# Patient Record
Sex: Female | Born: 1980 | Race: Black or African American | Hispanic: No | Smoking: Never smoker
Health system: Southern US, Community
[De-identification: ages and names within clinical notes are randomized; demographics above are authoritative.]

---

## 2018-03-03 DIAGNOSIS — F419 Anxiety disorder, unspecified: Secondary | ICD-10-CM | POA: Diagnosis not present

## 2018-03-03 DIAGNOSIS — R079 Chest pain, unspecified: Secondary | ICD-10-CM | POA: Diagnosis not present

## 2018-03-03 DIAGNOSIS — R002 Palpitations: Secondary | ICD-10-CM | POA: Diagnosis not present

## 2018-03-17 ENCOUNTER — Other Ambulatory Visit (HOSPITAL_COMMUNITY): Payer: Self-pay | Admitting: Nurse Practitioner

## 2018-03-17 DIAGNOSIS — R079 Chest pain, unspecified: Secondary | ICD-10-CM

## 2018-03-21 ENCOUNTER — Other Ambulatory Visit: Payer: Self-pay

## 2018-03-21 ENCOUNTER — Ambulatory Visit (HOSPITAL_COMMUNITY): Payer: 59 | Attending: Cardiovascular Disease

## 2018-03-21 DIAGNOSIS — I081 Rheumatic disorders of both mitral and tricuspid valves: Secondary | ICD-10-CM | POA: Diagnosis not present

## 2018-03-21 DIAGNOSIS — R079 Chest pain, unspecified: Secondary | ICD-10-CM | POA: Diagnosis not present

## 2018-04-15 DIAGNOSIS — F419 Anxiety disorder, unspecified: Secondary | ICD-10-CM | POA: Diagnosis not present

## 2018-04-15 DIAGNOSIS — Z8679 Personal history of other diseases of the circulatory system: Secondary | ICD-10-CM | POA: Diagnosis not present

## 2018-04-15 DIAGNOSIS — Z712 Person consulting for explanation of examination or test findings: Secondary | ICD-10-CM | POA: Diagnosis not present

## 2018-08-25 DIAGNOSIS — A059 Bacterial foodborne intoxication, unspecified: Secondary | ICD-10-CM | POA: Diagnosis not present

## 2018-08-25 DIAGNOSIS — R112 Nausea with vomiting, unspecified: Secondary | ICD-10-CM | POA: Diagnosis not present

## 2018-08-25 DIAGNOSIS — R197 Diarrhea, unspecified: Secondary | ICD-10-CM | POA: Diagnosis not present

## 2019-03-28 ENCOUNTER — Other Ambulatory Visit (HOSPITAL_COMMUNITY)
Admission: RE | Admit: 2019-03-28 | Discharge: 2019-03-28 | Disposition: A | Discharge status: Home / Self Care | Payer: 59 | Source: Ambulatory Visit | Attending: Nurse Practitioner | Admitting: Nurse Practitioner

## 2019-03-28 ENCOUNTER — Ambulatory Visit: Payer: 59 | Admitting: Nurse Practitioner

## 2019-03-28 ENCOUNTER — Other Ambulatory Visit: Payer: Self-pay

## 2019-03-28 ENCOUNTER — Encounter: Payer: Self-pay | Admitting: Nurse Practitioner

## 2019-03-28 VITALS — BP 110/70 | HR 76 | Temp 97.9°F | Ht 62.0 in | Wt 186.2 lb

## 2019-03-28 DIAGNOSIS — Z113 Encounter for screening for infections with a predominantly sexual mode of transmission: Secondary | ICD-10-CM | POA: Diagnosis not present

## 2019-03-28 DIAGNOSIS — R635 Abnormal weight gain: Secondary | ICD-10-CM

## 2019-03-28 NOTE — Progress Notes (Signed)
  Subjective:     Patient ID: Lisa Serrano , female    DOB: 06/30/1981 , 38 y.o.   MRN: [id number]   Chief Complaint  Patient presents with  . STD testing    HPI  She would like to have an STD check as routine, new partner.  She would also like to be checked for HIV and Syphyllis.  Has a history of Pelvic Inflammatory Disease.  She has tubal ligation.  No known exposure.   She has gained weight over the last 6 months - 10 lbs.  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  Exercising - peleton bike, treadmill, insanity and running.  Lift weights as well.      History reviewed. No pertinent past medical history.   Family History  Problem Relation Age of Onset  . Atrial fibrillation Mother   . Dementia Mother   . Hypothyroidism Sister     No current outpatient medications on file.   No Known Allergies   Review of Systems  Constitutional: Negative.   Respiratory: Negative.   Cardiovascular: Negative.  Negative for chest pain, palpitations and leg swelling.  Endocrine: Negative for polydipsia and polyphagia.  Skin: Negative.   Neurological: Negative for dizziness and headaches.  Psychiatric/Behavioral: Negative for agitation and confusion.     Today's Vitals   03/28/19 1213  BP: 110/70  Pulse: 76  Temp: 97.9 F (36.6 C)  TempSrc: Oral  Weight: 186 lb 3.2 oz (84.5 kg)  Height: 5\' 2"  (1.575 m)  PainSc: 0-No pain   Body mass index is 34.06 kg/m.   Objective:  Physical Exam Constitutional:      Appearance: Normal appearance.  Cardiovascular:     Rate and Rhythm: Normal rate and regular rhythm.     Pulses: Normal pulses.     Heart sounds: Normal heart sounds. No murmur.  Pulmonary:     Effort: Pulmonary effort is normal. No respiratory distress.     Breath sounds: Normal breath sounds.  Skin:    General: Skin is warm and dry.     Capillary Refill: Capillary refill takes less than 2 seconds.  Neurological:     General: No focal deficit present.   Mental Status: She is alert and oriented to person, place, and time.  Psychiatric:        Mood and Affect: Mood normal.        Behavior: Behavior normal.        Thought Content: Thought content normal.        Judgment: Judgment normal.         Assessment And Plan:     1. Weight gain  She has had a 10 lb weight gain in the last 6 months.    Will check metabolic panel  She is exercising regularly with Peleton   Encouraged to eat healthy diet low in sugar and carbs.  - TSH - CBC no Diff - Hemoglobin A1c  2. Screening examination for STD (sexually transmitted disease)  She has a new partner and would like to be checked for any STDs - HIV antibody (with reflex) - T pallidum Screening Cascade - Cervicovaginal ancillary only   Minette Brine, FNP    THE PATIENT IS ENCOURAGED TO PRACTICE SOCIAL DISTANCING DUE TO THE COVID-19 PANDEMIC.

## 2019-03-29 LAB — CBC
Hematocrit: 40.9 % (ref 34.0–46.6)
Hemoglobin: 13.8 g/dL (ref 11.1–15.9)
MCH: 34.3 pg — ABNORMAL HIGH (ref 26.6–33.0)
MCHC: 33.7 g/dL (ref 31.5–35.7)
MCV: 102 fL — ABNORMAL HIGH (ref 79–97)
Platelets: 216 10*3/uL (ref 150–450)
RBC: 4.02 x10E6/uL (ref 3.77–5.28)
RDW: 11.6 % — ABNORMAL LOW (ref 11.7–15.4)
WBC: 4.1 10*3/uL (ref 3.4–10.8)

## 2019-03-29 LAB — HEMOGLOBIN A1C
Est. average glucose Bld gHb Est-mCnc: 82 mg/dL
Hgb A1c MFr Bld: 4.5 % — ABNORMAL LOW (ref 4.8–5.6)

## 2019-03-29 LAB — T PALLIDUM SCREENING CASCADE: T pallidum Antibodies (TP-PA): NONREACTIVE

## 2019-03-29 LAB — HIV ANTIBODY (ROUTINE TESTING W REFLEX): HIV Screen 4th Generation wRfx: NONREACTIVE

## 2019-03-29 LAB — TSH: TSH: 3.29 u[IU]/mL (ref 0.450–4.500)

## 2019-03-31 LAB — CERVICOVAGINAL ANCILLARY ONLY
Bacterial vaginitis: POSITIVE — AB
Candida vaginitis: NEGATIVE
Chlamydia: NEGATIVE
Neisseria Gonorrhea: NEGATIVE
Trichomonas: NEGATIVE

## 2019-04-03 ENCOUNTER — Other Ambulatory Visit: Payer: Self-pay | Admitting: Nurse Practitioner

## 2019-04-03 DIAGNOSIS — B9689 Other specified bacterial agents as the cause of diseases classified elsewhere: Secondary | ICD-10-CM

## 2019-04-03 DIAGNOSIS — N760 Acute vaginitis: Secondary | ICD-10-CM

## 2019-04-03 MED ORDER — METRONIDAZOLE 500 MG PO TABS: 500.0000 mg | ORAL_TABLET | Freq: Two times a day (BID) | ORAL | 0 refills | Status: DC

## 2019-04-21 ENCOUNTER — Encounter: Payer: Self-pay | Admitting: Nurse Practitioner

## 2019-04-25 ENCOUNTER — Encounter: Payer: 59 | Admitting: Nurse Practitioner

## 2019-09-02 DIAGNOSIS — Z20828 Contact with and (suspected) exposure to other viral communicable diseases: Secondary | ICD-10-CM | POA: Diagnosis not present

## 2019-09-02 DIAGNOSIS — Z6830 Body mass index (BMI) 30.0-30.9, adult: Secondary | ICD-10-CM | POA: Diagnosis not present

## 2019-09-02 DIAGNOSIS — Z1389 Encounter for screening for other disorder: Secondary | ICD-10-CM | POA: Diagnosis not present

## 2019-09-02 DIAGNOSIS — Z1331 Encounter for screening for depression: Secondary | ICD-10-CM | POA: Diagnosis not present

## 2020-03-25 DIAGNOSIS — F411 Generalized anxiety disorder: Secondary | ICD-10-CM | POA: Diagnosis not present

## 2020-03-27 DIAGNOSIS — Z0001 Encounter for general adult medical examination with abnormal findings: Secondary | ICD-10-CM | POA: Diagnosis not present

## 2020-03-27 DIAGNOSIS — R5383 Other fatigue: Secondary | ICD-10-CM | POA: Diagnosis not present

## 2020-03-27 DIAGNOSIS — Z20822 Contact with and (suspected) exposure to covid-19: Secondary | ICD-10-CM | POA: Diagnosis not present

## 2020-03-27 DIAGNOSIS — F4322 Adjustment disorder with anxiety: Secondary | ICD-10-CM | POA: Diagnosis not present

## 2020-04-15 DIAGNOSIS — F411 Generalized anxiety disorder: Secondary | ICD-10-CM | POA: Diagnosis not present

## 2020-04-16 DIAGNOSIS — E559 Vitamin D deficiency, unspecified: Secondary | ICD-10-CM | POA: Diagnosis not present

## 2020-04-16 DIAGNOSIS — R790 Abnormal level of blood mineral: Secondary | ICD-10-CM | POA: Diagnosis not present

## 2020-04-16 DIAGNOSIS — Z6836 Body mass index (BMI) 36.0-36.9, adult: Secondary | ICD-10-CM | POA: Diagnosis not present

## 2020-04-16 DIAGNOSIS — E7800 Pure hypercholesterolemia, unspecified: Secondary | ICD-10-CM | POA: Diagnosis not present

## 2020-04-16 DIAGNOSIS — N951 Menopausal and female climacteric states: Secondary | ICD-10-CM | POA: Diagnosis not present

## 2020-04-18 DIAGNOSIS — R5382 Chronic fatigue, unspecified: Secondary | ICD-10-CM | POA: Diagnosis not present

## 2020-04-18 DIAGNOSIS — Z6836 Body mass index (BMI) 36.0-36.9, adult: Secondary | ICD-10-CM | POA: Diagnosis not present

## 2020-04-18 DIAGNOSIS — Z1339 Encounter for screening examination for other mental health and behavioral disorders: Secondary | ICD-10-CM | POA: Diagnosis not present

## 2020-04-18 DIAGNOSIS — E559 Vitamin D deficiency, unspecified: Secondary | ICD-10-CM | POA: Diagnosis not present

## 2020-04-18 DIAGNOSIS — N951 Menopausal and female climacteric states: Secondary | ICD-10-CM | POA: Diagnosis not present

## 2020-04-18 DIAGNOSIS — Z1331 Encounter for screening for depression: Secondary | ICD-10-CM | POA: Diagnosis not present

## 2020-04-18 DIAGNOSIS — F419 Anxiety disorder, unspecified: Secondary | ICD-10-CM | POA: Diagnosis not present

## 2020-04-18 DIAGNOSIS — R790 Abnormal level of blood mineral: Secondary | ICD-10-CM | POA: Diagnosis not present

## 2020-04-23 DIAGNOSIS — R5382 Chronic fatigue, unspecified: Secondary | ICD-10-CM | POA: Diagnosis not present

## 2020-04-23 DIAGNOSIS — N951 Menopausal and female climacteric states: Secondary | ICD-10-CM | POA: Diagnosis not present

## 2020-04-23 DIAGNOSIS — F5101 Primary insomnia: Secondary | ICD-10-CM | POA: Diagnosis not present

## 2020-04-24 DIAGNOSIS — E559 Vitamin D deficiency, unspecified: Secondary | ICD-10-CM | POA: Diagnosis not present

## 2020-04-24 DIAGNOSIS — E7800 Pure hypercholesterolemia, unspecified: Secondary | ICD-10-CM | POA: Diagnosis not present

## 2020-04-24 DIAGNOSIS — Z6836 Body mass index (BMI) 36.0-36.9, adult: Secondary | ICD-10-CM | POA: Diagnosis not present

## 2020-04-29 ENCOUNTER — Other Ambulatory Visit: Payer: 59

## 2020-04-30 ENCOUNTER — Telehealth: Payer: 59 | Admitting: Family

## 2020-04-30 DIAGNOSIS — U071 COVID-19: Secondary | ICD-10-CM

## 2020-04-30 MED ORDER — ALBUTEROL SULFATE HFA 108 (90 BASE) MCG/ACT IN AERS: 2.0000 | INHALATION_SPRAY | Freq: Four times a day (QID) | RESPIRATORY_TRACT | 0 refills | Status: DC | PRN

## 2020-04-30 MED ORDER — BENZONATATE 100 MG PO CAPS: 100.0000 mg | ORAL_CAPSULE | Freq: Three times a day (TID) | ORAL | 0 refills | Status: DC | PRN

## 2020-04-30 NOTE — Progress Notes (Signed)
We are sorry you are not feeling well. We are here to help! ° °You have tested positive for COVID-19, meaning that you were infected with the novel coronavirus and could give the germ to others.   ° °You have been enrolled in MyChart Home Monitoring for COVID-19. Daily you will receive a questionnaire within the MyChart website. Our COVID-19 response team will be monitoring your responses daily. ° °Please continue isolation at home, for at least 10 days since the start of your symptoms and until you have had 24 hours with no fever (without taking a fever reducer) and with improving of symptoms.  Please continue good preventive care measures, including:  frequent hand-washing, avoid touching your face, cover coughs/sneezes, stay out of crowds and keep a 6 foot distance from others.  Follow up with your provider or go to the nearest hospital ED for re-assessment if fever/cough/breathlessness return. ° °The following symptoms may appear 2-14 days after exposure: °• Fever °• Cough °• Shortness of breath or difficulty breathing °• Chills °• Repeated shaking with chills °• Muscle pain °• Headache °• Sore throat °• New loss of taste or smell °• Fatigue °• Congestion or runny nose °• Nausea or vomiting °• Diarrhea ° °Go to the nearest hospital ED for assessment if fever/cough/breathlessness are severe or illness seems like a threat to life.  It is vitally important that if you feel that you have an infection such as this virus or any other virus that you stay home and away from places where you may spread it to others.  You should avoid contact with people age 65 and older.  ° °You can use medication such as A prescription cough medication called Tessalon Perles 100 mg. You may take 1-2 capsules every 8 hours as needed for cough and A prescription inhaler called Albuterol MDI 90 mcg /actuation 2 puffs every 4 hours as needed for shortness of breath, wheezing, cough ° °You may also take acetaminophen (Tylenol) as needed for  fever. ° °Reduce your risk of any infection by using the same precautions used for avoiding the common cold or flu:  °• Wash your hands often with soap and warm water for at least 20 seconds.  If soap and water are not readily available, use an alcohol-based hand sanitizer with at least 60% alcohol.  °• If coughing or sneezing, cover your mouth and nose by coughing or sneezing into the elbow areas of your shirt or coat, into a tissue or into your sleeve (not your hands). °• Avoid shaking hands with others and consider head nods or verbal greetings only. °• Avoid touching your eyes, nose, or mouth with unwashed hands.  °• Avoid close contact with people who are sick. °• Avoid places or events with large numbers of people in one location, like concerts or sporting events. °• Carefully consider travel plans you have or are making. °• If you are planning any travel outside or inside the [place], visit the CDC's Travelers' Health webpage for the latest health notices. °• If you have some symptoms but not all symptoms, continue to monitor at home and seek medical attention if your symptoms worsen. °• If you are having a medical emergency, call 911. ° °HOME CARE °• Only take medications as instructed by your medical team. °• Drink plenty of fluids and get plenty of rest. °• A steam or ultrasonic humidifier can help if you have congestion.  ° °GET HELP RIGHT AWAY IF YOU HAVE EMERGENCY WARNING SIGNS** FOR COVID-19. If   you or someone is showing any of these signs seek emergency medical care immediately. Call 911 or proceed to your closest emergency facility if: °• You develop worsening high fever. °• Trouble breathing °• Bluish lips or face °• Persistent pain or pressure in the chest °• New confusion °• Inability to wake or stay awake °• You cough up blood. °• Your symptoms become more severe ° °**This list is not all possible symptoms. Contact your medical provider for any symptoms that are sever or concerning to you. ° °MAKE  SURE YOU  °· Understand these instructions. °· Will watch your condition. °· Will get help right away if you are not doing well or get worse. ° °Your e-visit answers were reviewed by a board certified advanced clinical practitioner to complete your personal care plan.  Depending on the condition, your plan could have included both over the counter or prescription medications.  If there is a problem please reply once you have received a response from your provider. ° °Your safety is important to us.  If you have drug allergies check your prescription carefully.   ° °You can use MyChart to ask questions about today's visit, request a non-urgent call back, or ask for a work or school excuse for 24 hours related to this e-Visit. If it has been greater than 24 hours you will need to follow up with your provider, or enter a new e-Visit to address those concerns. °You will get an e-mail in the next two days asking about your experience.  I hope that your e-visit has been valuable and will speed your recovery. Thank you for using e-visits. ° ° ° °Approximately 5 minutes was spent documenting and reviewing patient's chart.  ° ° ° °

## 2020-05-01 DIAGNOSIS — R05 Cough: Secondary | ICD-10-CM | POA: Diagnosis not present

## 2020-05-01 DIAGNOSIS — M5489 Other dorsalgia: Secondary | ICD-10-CM | POA: Diagnosis not present

## 2020-05-01 DIAGNOSIS — J398 Other specified diseases of upper respiratory tract: Secondary | ICD-10-CM | POA: Diagnosis not present

## 2020-05-01 DIAGNOSIS — U071 COVID-19: Secondary | ICD-10-CM | POA: Diagnosis not present

## 2020-05-01 DIAGNOSIS — Z6833 Body mass index (BMI) 33.0-33.9, adult: Secondary | ICD-10-CM | POA: Diagnosis not present

## 2020-05-01 DIAGNOSIS — Z8616 Personal history of COVID-19: Secondary | ICD-10-CM | POA: Diagnosis not present

## 2020-05-01 DIAGNOSIS — K8020 Calculus of gallbladder without cholecystitis without obstruction: Secondary | ICD-10-CM | POA: Diagnosis not present

## 2020-05-14 DIAGNOSIS — E559 Vitamin D deficiency, unspecified: Secondary | ICD-10-CM | POA: Diagnosis not present

## 2020-05-14 DIAGNOSIS — E7800 Pure hypercholesterolemia, unspecified: Secondary | ICD-10-CM | POA: Diagnosis not present

## 2020-05-14 DIAGNOSIS — Z6835 Body mass index (BMI) 35.0-35.9, adult: Secondary | ICD-10-CM | POA: Diagnosis not present

## 2020-05-20 ENCOUNTER — Telehealth: Payer: Self-pay

## 2020-05-20 NOTE — Telephone Encounter (Signed)
Pt consents to MYCHART visit 

## 2020-05-21 ENCOUNTER — Other Ambulatory Visit: Payer: Self-pay

## 2020-05-21 ENCOUNTER — Telehealth (INDEPENDENT_AMBULATORY_CARE_PROVIDER_SITE_OTHER): Payer: 59 | Admitting: Nurse Practitioner

## 2020-05-21 ENCOUNTER — Encounter: Payer: Self-pay | Admitting: Nurse Practitioner

## 2020-05-21 DIAGNOSIS — R110 Nausea: Secondary | ICD-10-CM | POA: Diagnosis not present

## 2020-05-21 DIAGNOSIS — R197 Diarrhea, unspecified: Secondary | ICD-10-CM | POA: Diagnosis not present

## 2020-05-21 DIAGNOSIS — M545 Low back pain, unspecified: Secondary | ICD-10-CM

## 2020-05-21 DIAGNOSIS — R1084 Generalized abdominal pain: Secondary | ICD-10-CM

## 2020-05-21 DIAGNOSIS — G8929 Other chronic pain: Secondary | ICD-10-CM

## 2020-05-21 MED ORDER — PREDNISONE 10 MG (21) PO TBPK: ORAL_TABLET | ORAL | 0 refills | Status: DC

## 2020-05-21 MED ORDER — DICYCLOMINE HCL 10 MG PO CAPS: 10.0000 mg | ORAL_CAPSULE | Freq: Four times a day (QID) | ORAL | 0 refills | Status: AC

## 2020-05-21 NOTE — Patient Instructions (Signed)

## 2020-05-21 NOTE — Progress Notes (Deleted)
  This visit occurred during the SARS-CoV-2 public health emergency.  Safety protocols were in place, including screening questions prior to the visit, additional usage of staff PPE, and extensive cleaning of exam room while observing appropriate contact time as indicated for disinfecting solutions.  Subjective:     Patient ID: Lisa Serrano , female    DOB: 1981-01-20 , 39 y.o.   MRN: [id number]   Chief Complaint  Patient presents with  . Diarrhea    patient stated she has still been having diarrhea and nausea  . Back Pain    patient stated her back pain has been worsening and she stated she had a CT scan in [place] and was told she has colitis     HPI  HPI   No past medical history on file.   Family History  Problem Relation Age of Onset  . Atrial fibrillation Mother   . Dementia Mother   . Hypothyroidism Sister      Current Outpatient Medications:  .  albuterol (VENTOLIN HFA) 108 (90 Base) MCG/ACT inhaler, Inhale 2 puffs into the lungs every 6 (six) hours as needed for wheezing or shortness of breath., Disp: 8 g, Rfl: 0 .  escitalopram (LEXAPRO) 20 MG tablet, Take 20 mg by mouth daily., Disp: , Rfl:  .  hydrOXYzine (VISTARIL) 25 MG capsule, Take 25 mg by mouth 3 (three) times daily as needed., Disp: , Rfl:    No Known Allergies   Review of Systems   There were no vitals filed for this visit. There is no height or weight on file to calculate BMI.   Objective:  Physical Exam      Assessment And Plan:     There are no diagnoses linked to this encounter.    Patient was given opportunity to ask questions. Patient verbalized understanding of the plan and was able to repeat key elements of the plan. All questions were answered to their satisfaction.  [address] [place], CMA   I, [place], [place], have reviewed all documentation for this visit. The documentation on 05/21/20 for the exam, diagnosis, procedures, and orders are all accurate and complete.  THE  PATIENT IS ENCOURAGED TO PRACTICE SOCIAL DISTANCING DUE TO THE COVID-19 PANDEMIC.

## 2020-05-21 NOTE — Progress Notes (Signed)
Virtual Visit via MyChart   This visit type was conducted due to national recommendations for restrictions regarding the COVID-19 Pandemic (e.g. social distancing) in an effort to limit this patient's exposure and mitigate transmission in our community.  Due to her co-morbid illnesses, this patient is at least at moderate risk for complications without adequate follow up.  This format is felt to be most appropriate for this patient at this time.  All issues noted in this document were discussed and addressed.  A limited physical exam was performed with this format.    This visit type was conducted due to national recommendations for restrictions regarding the COVID-19 Pandemic (e.g. social distancing) in an effort to limit this patient's exposure and mitigate transmission in our community.  Patients identity confirmed using two different identifiers.  This format is felt to be most appropriate for this patient at this time.  All issues noted in this document were discussed and addressed.  No physical exam was performed (except for noted visual exam findings with Video Visits).    Date:  06/16/2020   ID:  Lisa Serrano, DOB [birth year], MRN [id number]  Patient Location:  Car - spoke with Lovey Newcomer  Provider location:   Office    Chief Complaint:  She is having diarrhea and nausea after having covid.   History of Present Illness:    Lisa Serrano is a 39 y.o. female who presents via video conferencing for a telehealth visit today.    The patient does not have symptoms concerning for COVID-19 infection (fever, chills, cough, or new shortness of breath).   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 While in [place] she was seen at [facility].  She has continued having diarrhea and nausea.  She is only able to lay on her right side.  While in [place] had a CT scan due to the diarrhea and  back pain. She reports showed colitis and gallstones, mild sacroilitis.  zofran was the only medication she was prescribed. Avoid sugary foods and spicy foods. If symptoms are not better she was recommended to have an ultrasound.  Cramping starts after she eats. She does not have an appetite due to nausea - she has ran out of Zofran.   Diarrhea  The current episode started 1 to 4 weeks ago (2 weeks). The problem has been unchanged. The patient states that diarrhea does not awaken her from sleep. Associated symptoms include abdominal pain (cramping). Pertinent negatives include no chills, headaches or vomiting. She has tried nothing for the symptoms.  Back Pain This is a new problem. 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  ). Associated symptoms include abdominal pain (cramping). Pertinent negatives include no headaches.     No past medical history on file. No past surgical history on file.   Current Meds  Medication Sig  . albuterol (VENTOLIN HFA) 108 (90 Base) MCG/ACT inhaler Inhale 2 puffs into the lungs every 6 (six) hours as needed for wheezing or shortness of breath.  . escitalopram (LEXAPRO) 20 MG tablet Take 20 mg by mouth daily.  . hydrOXYzine (VISTARIL) 25 MG capsule Take 25 mg by mouth 3 (three) times daily as needed.     Allergies:   Patient has no known allergies.  Social History   Tobacco Use  . Smoking status: Never Smoker  . Smokeless tobacco: Never Used  Substance Use Topics  . Alcohol use: Yes  . Drug use: Never     Family Hx: The patient's family history includes Atrial fibrillation in her mother; Dementia in her mother; Hypothyroidism in her sister.  ROS:   Please see the history of present illness.    Review of Systems  Constitutional: Negative for chills.  Gastrointestinal: Positive for abdominal pain (cramping) and diarrhea. Negative for  vomiting.  Musculoskeletal: Positive for back pain.  Neurological: Negative for headaches.    All other systems reviewed and are negative.   Labs/Other Tests and Data Reviewed:    Recent Labs: No results found for requested labs within last 8760 hours.   Recent Lipid Panel No results found for: CHOL, TRIG, HDL, CHOLHDL, LDLCALC, LDLDIRECT  Wt Readings from Last 3 Encounters:  03/28/19 186 lb 3.2 oz (84.5 kg)     Exam:    Vital Signs:  There were no vitals taken for this visit.    Physical Exam Vitals reviewed.  Constitutional:      General: She is not in acute distress.    Appearance: Normal appearance.  Pulmonary:     Effort: Pulmonary effort is normal. No respiratory distress.  Neurological:     General: No focal deficit present.     Mental Status: She is alert.     Cranial Nerves: No cranial nerve deficit.  Psychiatric:        Mood and Affect: Mood normal.        Behavior: Behavior normal.        Thought Content: Thought content normal.        Judgment: Judgment normal.     ASSESSMENT & PLAN:    1. Diarrhea, unspecified type  She is to come in for labs in the morning - dicyclomine (BENTYL) 10 MG capsule; Take 1 capsule (10 mg total) by mouth 4 (four) times daily for 14 days.  Dispense: 56 capsule; Refill: 0 - CMP14+EGFR; Future - CBC no Diff  2. Generalized abdominal pain  Will check ultrasound since she had a CT scan while in [place]  Will check for any lumps or masses - US Abdomen Complete; Future  3. Nausea   - US Abdomen Complete; Future - CMP14+EGFR; Future  4. Chronic bilateral low back pain without sciatica  Pain to low back where she is uncomfortable   Will treat with prednisone - predniSONE (STERAPRED UNI-PAK 21 TAB) 10 MG (21) TBPK tablet; Take as directed  Dispense: 21 tablet; Refill: 0   COVID-19 Education: The signs and symptoms of COVID-19 were discussed with the patient and how to seek care for testing (follow up with PCP or  arrange E-visit).  The importance of social distancing was discussed today.  Patient Risk:   After full review of this patients clinical status, I feel that they are at least moderate risk at this time.  Time:   Today, I have spent 15 minutes/ seconds with the patient with telehealth technology discussing above diagnoses.     Medication Adjustments/Labs and Tests Ordered: Current medicines are reviewed at length with the patient today.  Concerns regarding medicines are outlined above.   Tests Ordered: Orders Placed This Encounter  Procedures  . US Abdomen Complete  . CMP14+EGFR  . CBC no Diff    Medication Changes: Meds ordered this encounter  Medications  . dicyclomine (BENTYL) 10 MG capsule  Sig: Take 1 capsule (10 mg total) by mouth 4 (four) times daily for 14 days.    Dispense:  56 capsule    Refill:  0  . predniSONE (STERAPRED UNI-PAK 21 TAB) 10 MG (21) TBPK tablet    Sig: Take as directed    Dispense:  21 tablet    Refill:  0    Disposition:  Follow up prn  Signed, Minette Brine, FNP

## 2020-05-24 DIAGNOSIS — M791 Myalgia, unspecified site: Secondary | ICD-10-CM | POA: Diagnosis not present

## 2020-05-24 DIAGNOSIS — K523 Indeterminate colitis: Secondary | ICD-10-CM | POA: Diagnosis not present

## 2020-05-24 DIAGNOSIS — M549 Dorsalgia, unspecified: Secondary | ICD-10-CM | POA: Diagnosis not present

## 2020-05-24 DIAGNOSIS — R197 Diarrhea, unspecified: Secondary | ICD-10-CM | POA: Diagnosis not present

## 2020-05-24 DIAGNOSIS — M2550 Pain in unspecified joint: Secondary | ICD-10-CM | POA: Diagnosis not present

## 2020-05-27 ENCOUNTER — Ambulatory Visit
Admission: RE | Admit: 2020-05-27 | Discharge: 2020-05-27 | Disposition: A | Discharge status: Home / Self Care | Payer: 59 | Source: Ambulatory Visit | Attending: Nurse Practitioner | Admitting: Nurse Practitioner

## 2020-05-27 ENCOUNTER — Other Ambulatory Visit: Payer: Self-pay | Admitting: Nurse Practitioner

## 2020-05-27 DIAGNOSIS — K8020 Calculus of gallbladder without cholecystitis without obstruction: Secondary | ICD-10-CM | POA: Diagnosis not present

## 2020-05-27 DIAGNOSIS — R1084 Generalized abdominal pain: Secondary | ICD-10-CM

## 2020-05-27 DIAGNOSIS — R110 Nausea: Secondary | ICD-10-CM

## 2020-06-16 ENCOUNTER — Encounter: Payer: Self-pay | Admitting: Nurse Practitioner

## 2020-07-15 ENCOUNTER — Encounter: Payer: Self-pay | Admitting: Nurse Practitioner

## 2020-08-05 DIAGNOSIS — K8020 Calculus of gallbladder without cholecystitis without obstruction: Secondary | ICD-10-CM | POA: Diagnosis not present

## 2020-08-06 DIAGNOSIS — K811 Chronic cholecystitis: Secondary | ICD-10-CM | POA: Diagnosis not present

## 2020-08-06 DIAGNOSIS — K8010 Calculus of gallbladder with chronic cholecystitis without obstruction: Secondary | ICD-10-CM | POA: Diagnosis not present

## 2020-08-06 DIAGNOSIS — K828 Other specified diseases of gallbladder: Secondary | ICD-10-CM | POA: Diagnosis not present

## 2020-08-06 DIAGNOSIS — K810 Acute cholecystitis: Secondary | ICD-10-CM | POA: Diagnosis not present

## 2020-08-07 DIAGNOSIS — K8010 Calculus of gallbladder with chronic cholecystitis without obstruction: Secondary | ICD-10-CM | POA: Diagnosis not present

## 2020-08-21 DIAGNOSIS — K523 Indeterminate colitis: Secondary | ICD-10-CM | POA: Diagnosis not present

## 2020-08-21 DIAGNOSIS — D259 Leiomyoma of uterus, unspecified: Secondary | ICD-10-CM | POA: Diagnosis not present

## 2020-08-21 DIAGNOSIS — R197 Diarrhea, unspecified: Secondary | ICD-10-CM | POA: Diagnosis not present

## 2020-08-21 DIAGNOSIS — Z6832 Body mass index (BMI) 32.0-32.9, adult: Secondary | ICD-10-CM | POA: Diagnosis not present

## 2020-08-21 DIAGNOSIS — Z8616 Personal history of COVID-19: Secondary | ICD-10-CM | POA: Diagnosis not present

## 2020-08-29 DIAGNOSIS — K640 First degree hemorrhoids: Secondary | ICD-10-CM | POA: Diagnosis not present

## 2020-08-29 DIAGNOSIS — K921 Melena: Secondary | ICD-10-CM | POA: Diagnosis not present

## 2020-08-29 DIAGNOSIS — K625 Hemorrhage of anus and rectum: Secondary | ICD-10-CM | POA: Diagnosis not present

## 2020-09-16 ENCOUNTER — Encounter: Payer: Self-pay | Admitting: Nurse Practitioner

## 2020-11-08 DIAGNOSIS — S134XXA Sprain of ligaments of cervical spine, initial encounter: Secondary | ICD-10-CM | POA: Diagnosis not present

## 2020-11-08 DIAGNOSIS — M542 Cervicalgia: Secondary | ICD-10-CM | POA: Diagnosis not present

## 2020-11-08 DIAGNOSIS — M545 Low back pain, unspecified: Secondary | ICD-10-CM | POA: Diagnosis not present

## 2020-11-26 DIAGNOSIS — Z6332 Other absence of family member: Secondary | ICD-10-CM | POA: Diagnosis not present

## 2020-11-26 DIAGNOSIS — E8881 Metabolic syndrome: Secondary | ICD-10-CM | POA: Diagnosis not present

## 2020-11-26 DIAGNOSIS — Z6832 Body mass index (BMI) 32.0-32.9, adult: Secondary | ICD-10-CM | POA: Diagnosis not present

## 2021-01-02 DIAGNOSIS — Z20822 Contact with and (suspected) exposure to covid-19: Secondary | ICD-10-CM | POA: Diagnosis not present

## 2021-01-03 ENCOUNTER — Other Ambulatory Visit (HOSPITAL_COMMUNITY): Payer: Self-pay

## 2021-01-03 MED ORDER — OZEMPIC (0.25 OR 0.5 MG/DOSE) 2 MG/1.5ML ~~LOC~~ SOPN
0.5000 mg | PEN_INJECTOR | SUBCUTANEOUS | 0 refills | Status: DC
  Filled 2021-01-03: qty 4.5, 84d supply, fill #0

## 2021-01-28 DIAGNOSIS — S335XXA Sprain of ligaments of lumbar spine, initial encounter: Secondary | ICD-10-CM | POA: Diagnosis not present

## 2021-01-28 DIAGNOSIS — M461 Sacroiliitis, not elsewhere classified: Secondary | ICD-10-CM | POA: Diagnosis not present

## 2021-02-25 DIAGNOSIS — S335XXA Sprain of ligaments of lumbar spine, initial encounter: Secondary | ICD-10-CM | POA: Diagnosis not present

## 2021-02-25 DIAGNOSIS — M461 Sacroiliitis, not elsewhere classified: Secondary | ICD-10-CM | POA: Diagnosis not present

## 2021-03-03 ENCOUNTER — Other Ambulatory Visit (HOSPITAL_COMMUNITY): Payer: Self-pay

## 2021-03-03 MED ORDER — OZEMPIC (1 MG/DOSE) 4 MG/3ML ~~LOC~~ SOPN
PEN_INJECTOR | SUBCUTANEOUS | 1 refills | Status: DC
  Filled 2021-03-03: qty 9, 84d supply, fill #0

## 2021-03-03 MED ORDER — OZEMPIC (0.25 OR 0.5 MG/DOSE) 2 MG/1.5ML ~~LOC~~ SOPN
0.5000 mg | PEN_INJECTOR | SUBCUTANEOUS | 1 refills | Status: DC
  Filled 2021-03-03: qty 4.5, 84d supply, fill #0

## 2021-04-08 DIAGNOSIS — Z3202 Encounter for pregnancy test, result negative: Secondary | ICD-10-CM | POA: Diagnosis not present

## 2021-04-08 DIAGNOSIS — E860 Dehydration: Secondary | ICD-10-CM | POA: Diagnosis not present

## 2021-04-08 DIAGNOSIS — Z20822 Contact with and (suspected) exposure to covid-19: Secondary | ICD-10-CM | POA: Diagnosis not present

## 2021-04-14 DIAGNOSIS — R634 Abnormal weight loss: Secondary | ICD-10-CM | POA: Diagnosis not present

## 2021-04-14 DIAGNOSIS — R1013 Epigastric pain: Secondary | ICD-10-CM | POA: Diagnosis not present

## 2021-04-14 DIAGNOSIS — R197 Diarrhea, unspecified: Secondary | ICD-10-CM | POA: Diagnosis not present

## 2021-04-14 DIAGNOSIS — R112 Nausea with vomiting, unspecified: Secondary | ICD-10-CM | POA: Diagnosis not present

## 2021-04-29 DIAGNOSIS — R1010 Upper abdominal pain, unspecified: Secondary | ICD-10-CM | POA: Diagnosis not present

## 2021-06-19 ENCOUNTER — Other Ambulatory Visit (HOSPITAL_COMMUNITY): Payer: Self-pay

## 2021-06-19 MED ORDER — OZEMPIC (0.25 OR 0.5 MG/DOSE) 2 MG/1.5ML ~~LOC~~ SOPN
0.5000 mg | PEN_INJECTOR | SUBCUTANEOUS | 0 refills | Status: DC
  Filled 2021-06-19: qty 3, 56d supply, fill #0

## 2021-07-06 DIAGNOSIS — Z23 Encounter for immunization: Secondary | ICD-10-CM | POA: Diagnosis not present

## 2021-08-04 ENCOUNTER — Ambulatory Visit: Payer: 59 | Admitting: Nurse Practitioner

## 2021-08-04 ENCOUNTER — Other Ambulatory Visit: Payer: Self-pay

## 2021-08-04 ENCOUNTER — Encounter: Payer: Self-pay | Admitting: Nurse Practitioner

## 2021-08-04 VITALS — BP 110/70 | HR 80 | Temp 98.3°F | Ht 62.0 in | Wt 159.4 lb

## 2021-08-04 DIAGNOSIS — M25512 Pain in left shoulder: Secondary | ICD-10-CM | POA: Diagnosis not present

## 2021-08-04 DIAGNOSIS — G8929 Other chronic pain: Secondary | ICD-10-CM

## 2021-08-04 MED ORDER — DICLOFENAC SODIUM 1 % EX GEL: 2.0000 g | Freq: Four times a day (QID) | CUTANEOUS | 2 refills | Status: AC
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
## 2021-08-08 ENCOUNTER — Other Ambulatory Visit (HOSPITAL_COMMUNITY): Payer: Self-pay

## 2021-08-08 ENCOUNTER — Other Ambulatory Visit: Payer: Self-pay

## 2021-08-08 MED ORDER — OZEMPIC (0.25 OR 0.5 MG/DOSE) 2 MG/1.5ML ~~LOC~~ SOPN
0.5000 mg | PEN_INJECTOR | SUBCUTANEOUS | 0 refills | Status: DC
  Filled 2021-08-08: qty 1.5, 28d supply, fill #0

## 2021-08-11 ENCOUNTER — Other Ambulatory Visit (HOSPITAL_COMMUNITY): Payer: Self-pay

## 2021-09-07 ENCOUNTER — Other Ambulatory Visit: Payer: Self-pay

## 2021-09-08 ENCOUNTER — Other Ambulatory Visit: Payer: Self-pay

## 2021-09-09 ENCOUNTER — Other Ambulatory Visit: Payer: Self-pay

## 2021-09-09 DIAGNOSIS — M7582 Other shoulder lesions, left shoulder: Secondary | ICD-10-CM | POA: Diagnosis not present

## 2021-09-09 DIAGNOSIS — M7542 Impingement syndrome of left shoulder: Secondary | ICD-10-CM | POA: Diagnosis not present

## 2021-09-09 MED ORDER — OZEMPIC (0.25 OR 0.5 MG/DOSE) 2 MG/1.5ML ~~LOC~~ SOPN
0.5000 mg | PEN_INJECTOR | SUBCUTANEOUS | 0 refills | Status: AC
  Filled 2021-09-09: qty 1.5, 28d supply, fill #0

## 2021-09-09 MED ORDER — OZEMPIC (0.25 OR 0.5 MG/DOSE) 2 MG/1.5ML ~~LOC~~ SOPN
0.5000 mg | PEN_INJECTOR | SUBCUTANEOUS | 0 refills | Status: AC
  Filled 2021-10-31: qty 3, 56d supply, fill #0

## 2021-09-10 ENCOUNTER — Other Ambulatory Visit: Payer: Self-pay

## 2021-09-10 MED ORDER — OZEMPIC (0.25 OR 0.5 MG/DOSE) 2 MG/1.5ML ~~LOC~~ SOPN
PEN_INJECTOR | SUBCUTANEOUS | 0 refills | Status: AC
Start: 2021-09-09 — End: ?
  Filled 2021-09-10 – 2021-09-11 (×2): qty 3, fill #0
  Filled 2021-09-11: qty 1.5, 28d supply, fill #0
  Filled 2021-10-03: qty 1.5, 28d supply, fill #1

## 2021-09-11 ENCOUNTER — Other Ambulatory Visit: Payer: Self-pay

## 2021-09-11 ENCOUNTER — Other Ambulatory Visit (HOSPITAL_COMMUNITY): Payer: Self-pay

## 2021-09-23 DIAGNOSIS — M7582 Other shoulder lesions, left shoulder: Secondary | ICD-10-CM | POA: Diagnosis not present

## 2021-10-03 ENCOUNTER — Other Ambulatory Visit (HOSPITAL_COMMUNITY): Payer: Self-pay

## 2021-10-06 ENCOUNTER — Other Ambulatory Visit (HOSPITAL_COMMUNITY): Payer: Self-pay

## 2021-10-21 DIAGNOSIS — M7582 Other shoulder lesions, left shoulder: Secondary | ICD-10-CM | POA: Diagnosis not present

## 2021-10-21 DIAGNOSIS — M7542 Impingement syndrome of left shoulder: Secondary | ICD-10-CM | POA: Diagnosis not present

## 2021-10-31 ENCOUNTER — Other Ambulatory Visit (HOSPITAL_COMMUNITY): Payer: Self-pay

## 2022-05-19 IMAGING — US US ABDOMEN COMPLETE
1 series · 14 of 25 positions shown · non-contrast
Comparison: None.

CLINICAL DATA: Abdominal pain with cramping and diarrhea for 3
weeks.

EXAM:
ABDOMEN ULTRASOUND COMPLETE

[Series 1: us abdomen complete · 0.20mm/px · 14 of 84 slices shown]
[im 1/84]
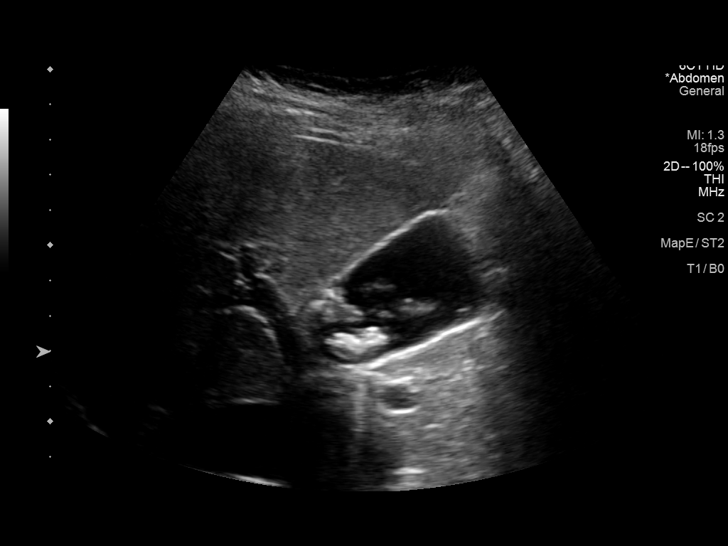
[im 7/84]
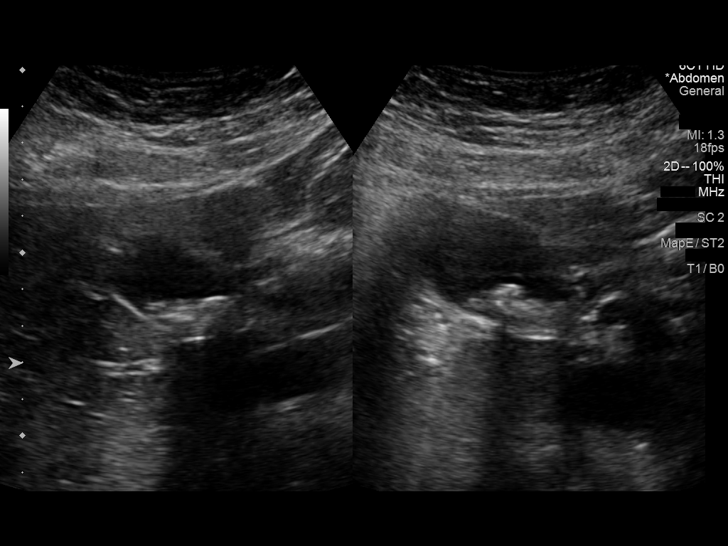
[im 14/84]
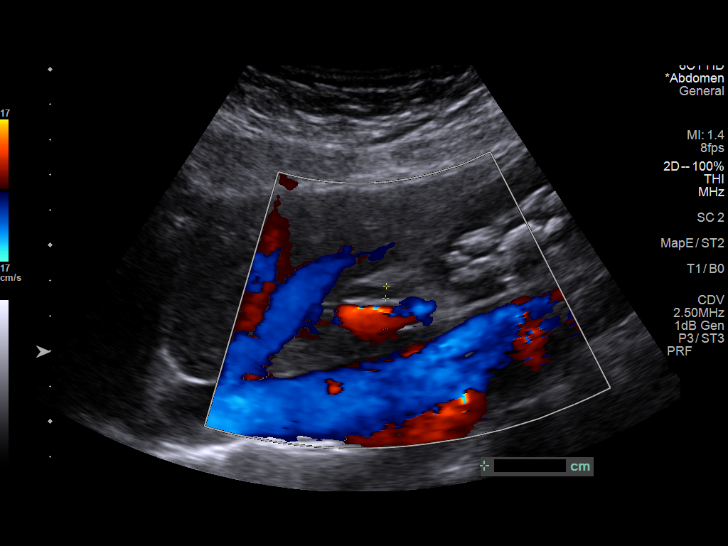
[im 21/84]
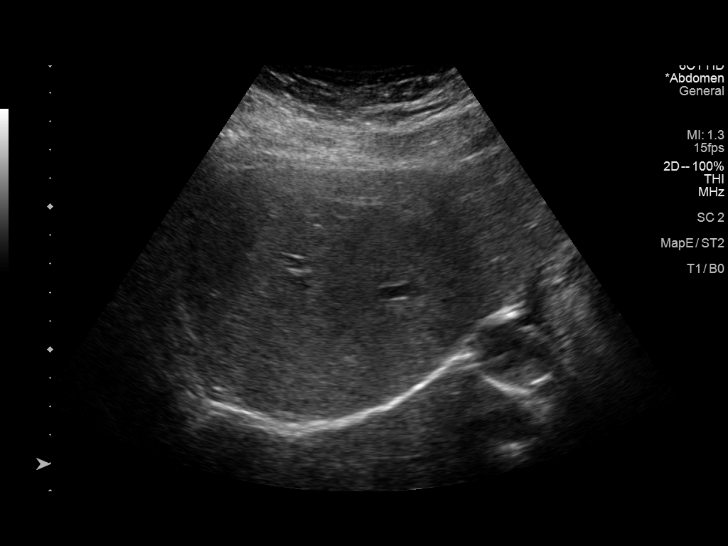
[im 28/84]
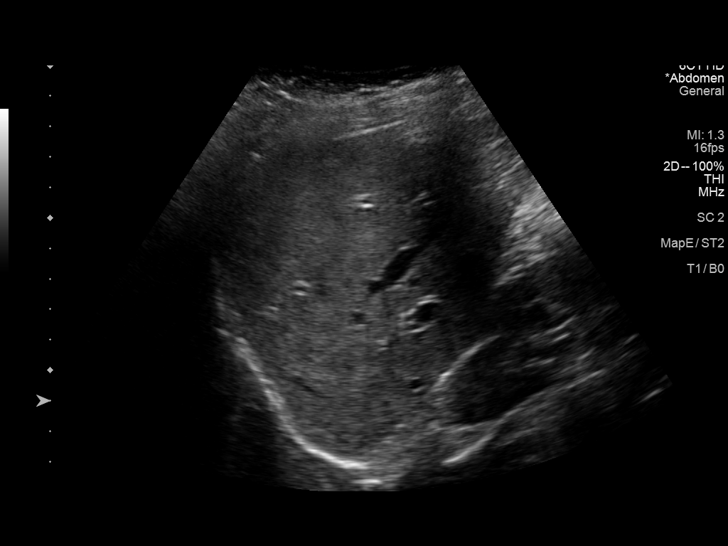
[im 32/84]
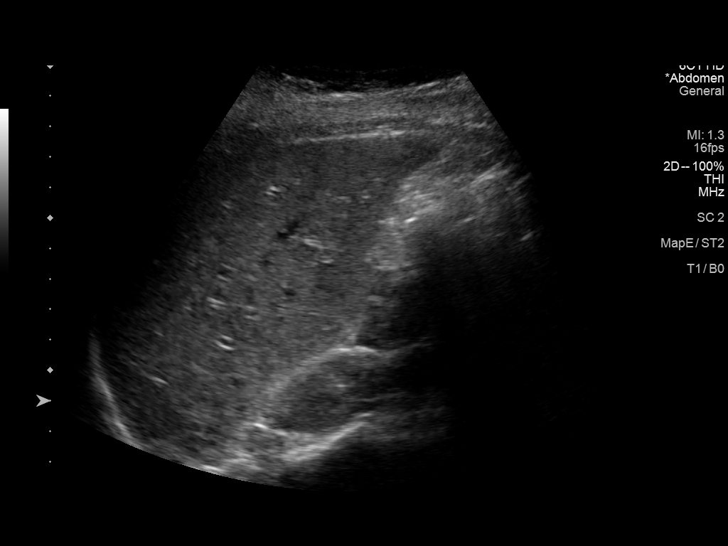
[im 39/84]
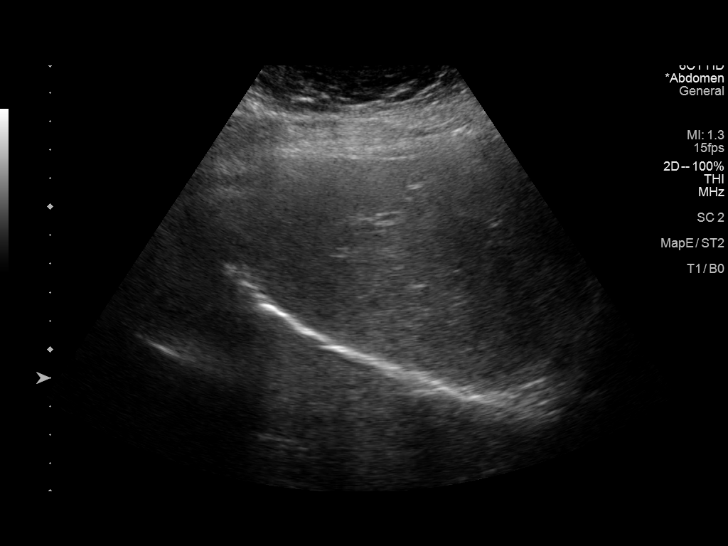
[im 45/84]
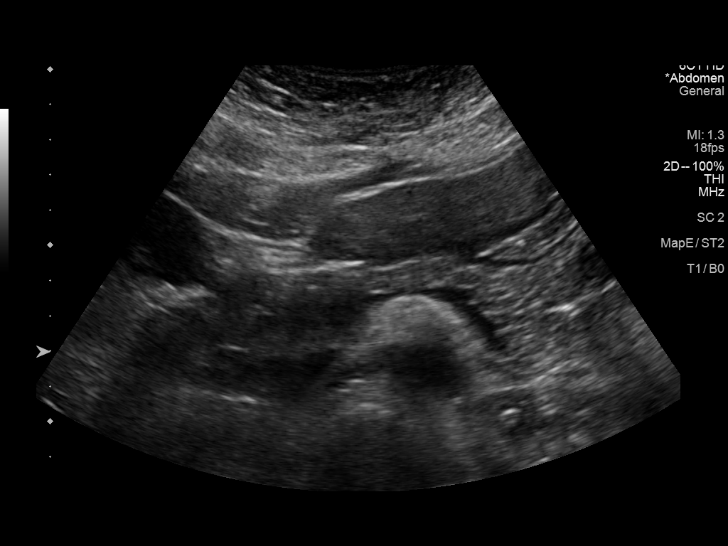
[im 52/84]
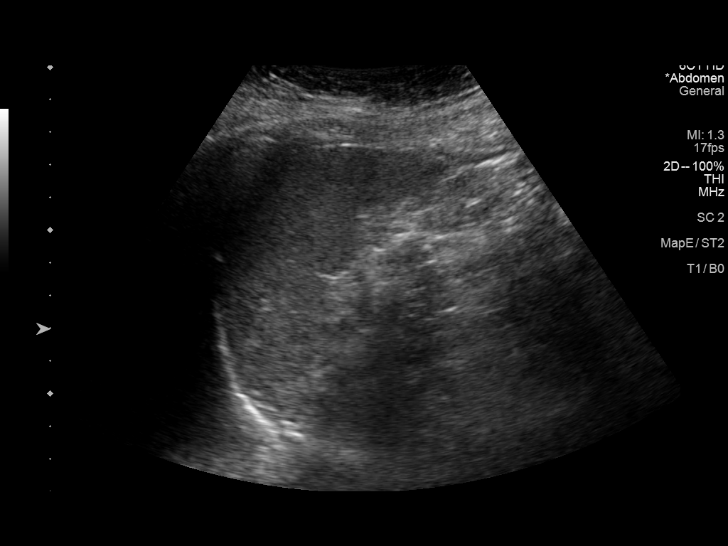
[im 56/84]
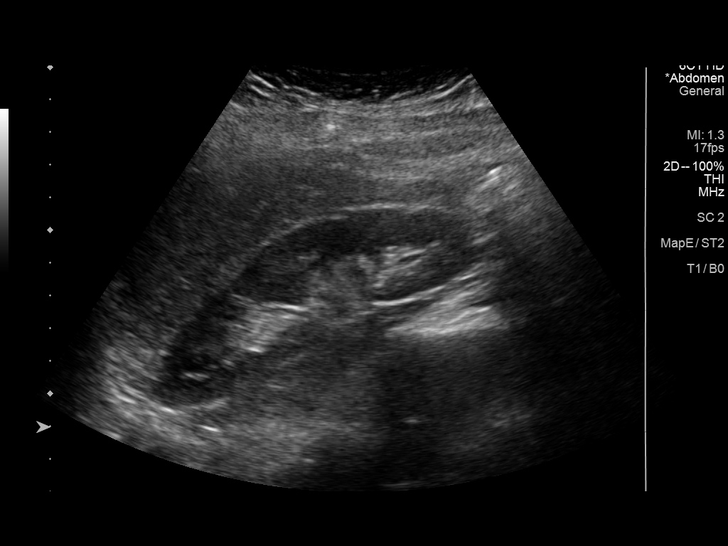
[im 63/84]
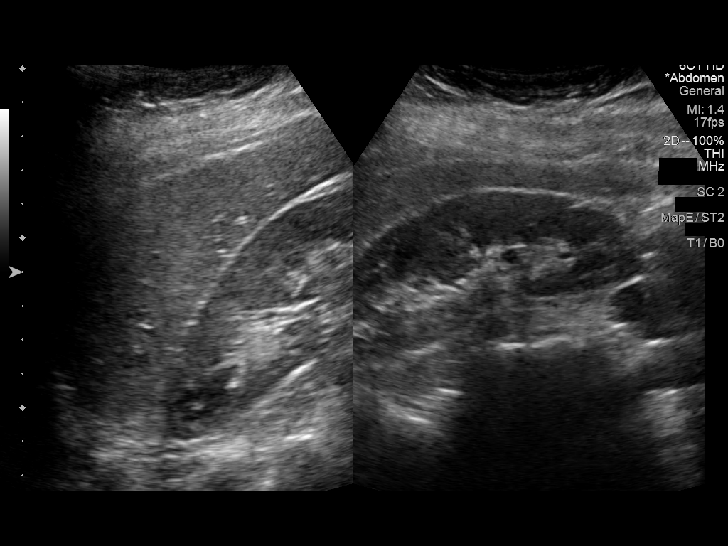
[im 70/84]
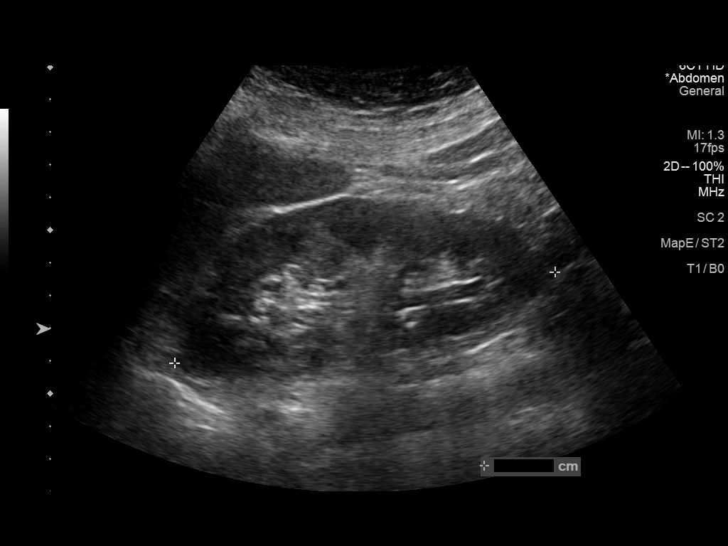
[im 77/84]
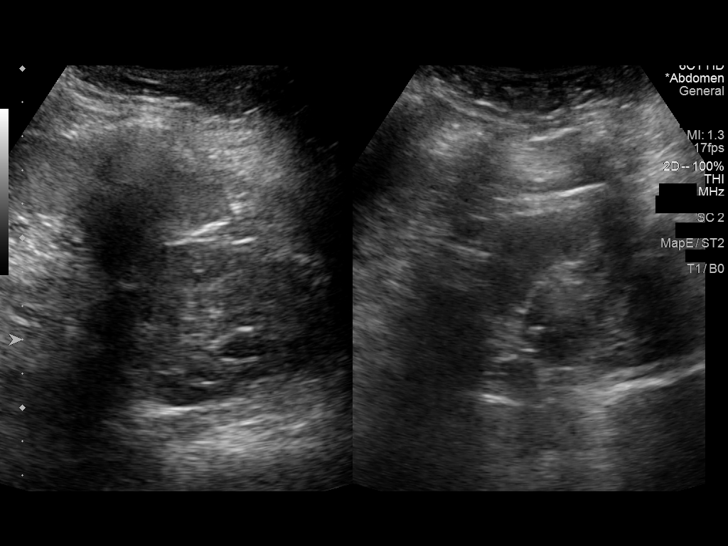
[im 84/84]
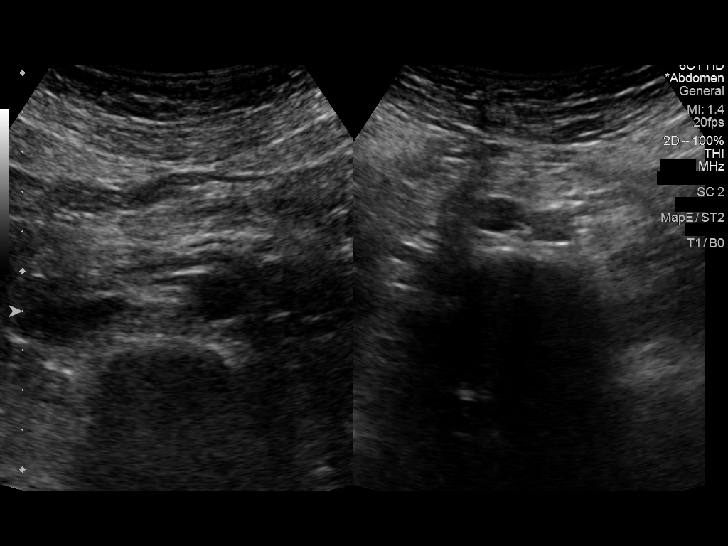

[14 of 25 positions shown; findings below may reference images not displayed]

FINDINGS: Gallbladder: Multiple large shadowing gallstones measuring up to
cm. No evidence of gallbladder wall thickening. Negative sonographic
Murphy sign.

Common bile duct: Diameter: 0.3 cm, within normal limits.  The

Liver: No focal lesion identified. Within normal limits in
parenchymal echogenicity. Portal vein is patent on color Doppler
imaging with normal direction of blood flow towards the liver.

IVC: No abnormality visualized.

Pancreas: Visualized portion unremarkable.

Spleen: Size and appearance within normal limits.

Right Kidney: Length: 11.1 cm. Echogenicity within normal limits. No
mass or hydronephrosis visualized.

Left Kidney: Length: 12.0 cm. Echogenicity within normal limits. No
mass or hydronephrosis visualized.

Abdominal aorta: No aneurysm visualized.

Other findings: None.
IMPRESSION: 1.  Cholelithiasis without evidence of cholecystitis.

2.  No other acute finding sonographically in the abdomen.
